# Patient Record
Sex: Female | Born: 1995 | Race: White | Hispanic: No | Marital: Single | Smoking: Never smoker
Health system: Southern US, Community
[De-identification: ages and names within clinical notes are randomized; demographics above are authoritative.]

---
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
## 2015-03-16 ENCOUNTER — Other Ambulatory Visit: Payer: Self-pay | Admitting: Family Medicine

## 2015-03-16 DIAGNOSIS — M25551 Pain in right hip: Secondary | ICD-10-CM

## 2015-03-23 ENCOUNTER — Ambulatory Visit
Admission: RE | Admit: 2015-03-23 | Discharge: 2015-03-23 | Disposition: A | Discharge status: Home / Self Care | Payer: 59 | Source: Ambulatory Visit | Attending: Family Medicine | Admitting: Family Medicine

## 2015-03-23 DIAGNOSIS — M25551 Pain in right hip: Secondary | ICD-10-CM | POA: Insufficient documentation

## 2015-03-23 DIAGNOSIS — Z01812 Encounter for preprocedural laboratory examination: Secondary | ICD-10-CM | POA: Insufficient documentation

## 2015-03-23 LAB — HCG, QUANTITATIVE, PREGNANCY

## 2015-03-23 MED ORDER — IOHEXOL 180 MG/ML  SOLN: 3.0000 mL | Freq: Once | INTRAMUSCULAR | Status: DC | PRN

## 2015-03-23 MED ORDER — METHYLPREDNISOLONE ACETATE 40 MG/ML IJ SUSP
80.0000 mg | Freq: Once | INTRAMUSCULAR | Status: DC
  Filled 2015-03-23: qty 2

## 2015-03-23 MED ORDER — BUPIVACAINE HCL 0.25 % IJ SOLN
7.0000 mL | Freq: Once | INTRAMUSCULAR | Status: DC
  Filled 2015-03-23: qty 7

## 2015-04-21 DIAGNOSIS — R102 Pelvic and perineal pain: Secondary | ICD-10-CM

## 2015-06-08 ENCOUNTER — Ambulatory Visit: Payer: 59 | Admitting: Family Medicine

## 2015-06-09 ENCOUNTER — Ambulatory Visit (INDEPENDENT_AMBULATORY_CARE_PROVIDER_SITE_OTHER): Payer: PRIVATE HEALTH INSURANCE | Admitting: Family Medicine

## 2015-06-09 ENCOUNTER — Other Ambulatory Visit: Payer: Self-pay | Admitting: Family Medicine

## 2015-06-09 ENCOUNTER — Ambulatory Visit
Admission: RE | Admit: 2015-06-09 | Discharge: 2015-06-09 | Disposition: A | Discharge status: Home / Self Care | Payer: 59 | Source: Ambulatory Visit | Attending: Family Medicine | Admitting: Family Medicine

## 2015-06-09 DIAGNOSIS — M25561 Pain in right knee: Secondary | ICD-10-CM

## 2015-06-12 ENCOUNTER — Ambulatory Visit: Payer: 59

## 2015-06-16 NOTE — Progress Notes (Signed)
Patient ID: Wendee Beaversurelia Lacek, female   DOB: 07/10/1995, 20 y.o.   MRN: [id number]  Patient presents today with symptoms of right posterior knee pain. Patient states that she noticed the pain a few days ago. She denies any injury prior to feeling the discomfort. She did initially noticed some bruising but that has resolved. She denies any calf swelling or tenderness. She denies any history of DVT.  ROS: Negative except mentioned above. Vitals: T 98.0, RR 16, HR 68, BP 112/68 GENERAL: NAD HEENT: no pharyngeal erythema, no exudate RESP: CTA B CARD: RRR MSK: no deformity of LEs appreciated, no ecchymosis of LEs or obvious swelling, mild tenderness along proximal aspect of calf muscle along posterior aspect of knee, no knee effusion, no jointline tenderness, FROM of LEs, -Homans, LEs nv intact   A/P: Right posterior knee pain- I believe her pain is likely related to calf muscle strain, however given her symptoms will do a ultrasound to rule out DVT and Baker's cyst. Will inform patient of results once they have been called to me.

## 2015-07-10 HISTORY — PX: HIP ARTHROSCOPY W/ LABRAL REPAIR: SHX1750

## 2015-07-17 ENCOUNTER — Ambulatory Visit (INDEPENDENT_AMBULATORY_CARE_PROVIDER_SITE_OTHER): Payer: 59 | Admitting: Family Medicine

## 2015-07-17 DIAGNOSIS — J069 Acute upper respiratory infection, unspecified: Secondary | ICD-10-CM

## 2015-07-18 NOTE — Progress Notes (Signed)
Patient ID: Marisa Bowen, female   DOB: [birth year], 20 y.o.   MRN: [id number]  Patient presents today with symptoms of postnasal drip, sore throat, cough. Patient states that she's had the symptoms for the last few days. She denies any colored mucus. She has been taking her oral antihistamine from [place]. She denies any fever, chest pain, shortness of breath, headache, nausea, vomiting, diarrhea.  ROS: Negative except mentioned above. Vitals T97.8, RR14, HR 70, BP 110/60  GENERAL: NAD HEENT: mild pharyngeal erythema, no exudate, no erythema of TMs, no cervical LAD RESP: CTA B CARD: RRR NEURO: CN II-XII grossly intact   A/P: URI-likely viral or seasonal allergies, continue antihistamine, Flonase when necessary, Delsym when necessary, rest, hydration, seek medical attention if symptoms persist or worsen. No athletic activity if febrile.

## 2015-12-14 ENCOUNTER — Encounter: Payer: Self-pay | Admitting: Family Medicine

## 2015-12-14 ENCOUNTER — Ambulatory Visit (INDEPENDENT_AMBULATORY_CARE_PROVIDER_SITE_OTHER): Payer: PRIVATE HEALTH INSURANCE | Admitting: Family Medicine

## 2015-12-14 VITALS — BP 129/75 | HR 73 | Temp 98.7°F

## 2015-12-14 DIAGNOSIS — J209 Acute bronchitis, unspecified: Secondary | ICD-10-CM

## 2015-12-14 MED ORDER — AZITHROMYCIN 250 MG PO TABS: ORAL_TABLET | ORAL | 0 refills | Status: DC

## 2015-12-14 MED ORDER — ALBUTEROL SULFATE HFA 108 (90 BASE) MCG/ACT IN AERS: 2.0000 | INHALATION_SPRAY | Freq: Four times a day (QID) | RESPIRATORY_TRACT | 2 refills | Status: DC | PRN

## 2015-12-14 NOTE — Progress Notes (Signed)
Patient presents today with symptoms of persistent cough for the last few weeks. Patient states initially she thought her symptoms got better for a few days but then returned. She does usually have allergies during the spring. She does play golf. She has noticed a little bit of wheezing at times with activity. She denies any calf tenderness or any other chest pain. She denies any documented fever. She has had bronchitis in the past. She denies any nausea, vomiting, abdominal pain, severe headache. She is not taking any medications this morning.  ROS: Negative except mentioned above.  Vitals as per Epic.  GENERAL: NAD HEENT: mild pharyngeal erythema, no exudate, no erythema of TMs, no cervical LAD RESP: CTA B CARD: RRR NEURO: CN II-XII grossly intact   A/P: URI - Z-pk, Claritin, Delsym, Albuterol MDI prn, rest, hydration, seek medical attention if symptoms persist or worsen as discussed.

## 2016-06-18 ENCOUNTER — Ambulatory Visit (INDEPENDENT_AMBULATORY_CARE_PROVIDER_SITE_OTHER): Payer: 59 | Admitting: Family Medicine

## 2016-06-18 ENCOUNTER — Encounter: Payer: Self-pay | Admitting: Family Medicine

## 2016-06-18 VITALS — BP 113/62 | HR 62 | Temp 97.7°F | Resp 14

## 2016-06-18 DIAGNOSIS — J069 Acute upper respiratory infection, unspecified: Secondary | ICD-10-CM

## 2016-06-18 DIAGNOSIS — J3089 Other allergic rhinitis: Secondary | ICD-10-CM

## 2016-06-18 MED ORDER — BENZONATATE 100 MG PO CAPS
100.0000 mg | ORAL_CAPSULE | Freq: Two times a day (BID) | ORAL | 0 refills | Status: AC | PRN
Start: 2016-06-18 — End: ?

## 2016-06-18 NOTE — Progress Notes (Signed)
Patient presents today with symptoms of mild nonproductive cough. Patient states that she's had the symptoms for the last 1-2 weeks. She denies any chest pain or shortness of breath. She has started her allergy medication (antihistamine) for seasonal allergies. She denies any wheezing or history of asthma. She denies any fever.  ROS: Negative except mentioned above. Vitals as per Epic. GENERAL: NAD HEENT: no pharyngeal erythema, no exudate, no erythema of TMs, no cervical LAD RESP: CTA B CARD: RRR  NEURO: CN II-XII grossly intact   A/P: URI, seasonal allergies -will try Tessalon Perles for cough suppressant, continue allergy medication, rest, hydration, if symptoms persist or worsen will do further workup/treatment.

## 2016-06-25 ENCOUNTER — Other Ambulatory Visit: Payer: Self-pay | Admitting: Family Medicine

## 2016-06-25 MED ORDER — AZITHROMYCIN 250 MG PO TABS
ORAL_TABLET | ORAL | 0 refills | Status: DC
Start: 2016-06-25 — End: 2017-03-13

## 2016-11-07 ENCOUNTER — Encounter: Payer: Self-pay | Admitting: Family Medicine

## 2016-11-07 ENCOUNTER — Ambulatory Visit (INDEPENDENT_AMBULATORY_CARE_PROVIDER_SITE_OTHER): Payer: Self-pay | Admitting: Family Medicine

## 2016-11-07 VITALS — BP 121/74 | HR 70 | Temp 97.9°F | Resp 14 | Wt 150.0 lb

## 2016-11-07 DIAGNOSIS — E282 Polycystic ovarian syndrome: Secondary | ICD-10-CM

## 2016-11-07 NOTE — Progress Notes (Signed)
Patient presents today for request for changing of meal plan. Patient has PCOS and would like to look more meals at home. She requests that I write something stating she can change her meal plan based on medical condition. Patient states that she would like to eat more foods with a low glycemic index as she has been told this is helpful for PCOS. She currently has an IUD in place. She states that she does not want to take any medications for her PCOS. A prescription was written to change her meal plan. I have also encouraged her to meet with the nutritionist. She will discuss making an appointment with trainer for this. She will follow up with the gynecologist if she has any further problems.

## 2016-11-14 ENCOUNTER — Encounter: Payer: Self-pay | Admitting: Dietician

## 2016-11-15 NOTE — Progress Notes (Signed)
   Marisa Bowen  Visit Type:  Initial  Appt. Start Time: 4:15pm Appt. End Time: 5:15pm  11/14/16  Ms. 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 States she eats vegan not or moral reasons but rather because meat and dairy "discust" her in [place] and so prefers not to consume these food groups. She has also been eating less gluten-containing foods since learning of her PCOS diagnosis. Foods consumed regularly include soy products, chickpeas, apples, prunes, cous cous, bulgar wheat, vegan crepes. Diet recall reveals frequent skipping of breakfast and dinner meals. Often prefers to snack in the evenings rather than have a complete/balanced meal. On campus she tries to find options in the vegan section at [employer] dining hall and at retail locations like [employer] [facility] [id number]. Admits to not consume much protein unless she cooks for herself at home. Enjoys protein/ veggie pasta varieties like chickpea pasta. Takes Vit C and Biotin supplement. RD advised she ensure that the Biotin supplement has Vit B12 included, and to also consider either consuming more dietary sources of iron or taking an iron supplement.   Individualized Plan  Learning Objective:  Athlete will have a greater understanding of how to consume adequate protein on a vegan diet and of how to eat with POCS (examples: high fiber, antioxidants/ polyphenols/ anti-inflammatory foods)    Plan:   She will continue to try and find different sources of Bowen on campus while following a vegan/ low gluten diet. Should she be unsuccessful at doing this, she wishes to cook meals at home rather than have a traditional meal plan. States she is not willing to consume non-vegan options at this time. RD to contact sports medicine MD.  Expected Outcomes:     1. She will follow diet guidelines for  POCS 2. She will meet with the campus dining hall dietitian, Marchelle Folksmanda, who may be able to further help her find vegan options on campus  Education material provided: Bowen guidelines for PCOS  If problems or questions, patient to contact team via:  Phone and Email  Future Bowen appointment:  prn    Marianna PaymentLisa N Malea Swilling, RD 11/14/16

## 2017-01-28 IMAGING — US US EXTREM LOW VENOUS*R*
1 series · 13 of 24 positions shown · non-contrast
Comparison: None.

CLINICAL DATA: Posterior right knee pain for 8 days.



[Series 1: us extrem low venous*right* · 0.07mm/px · 13 of 32 slices shown]
[im 1/32]
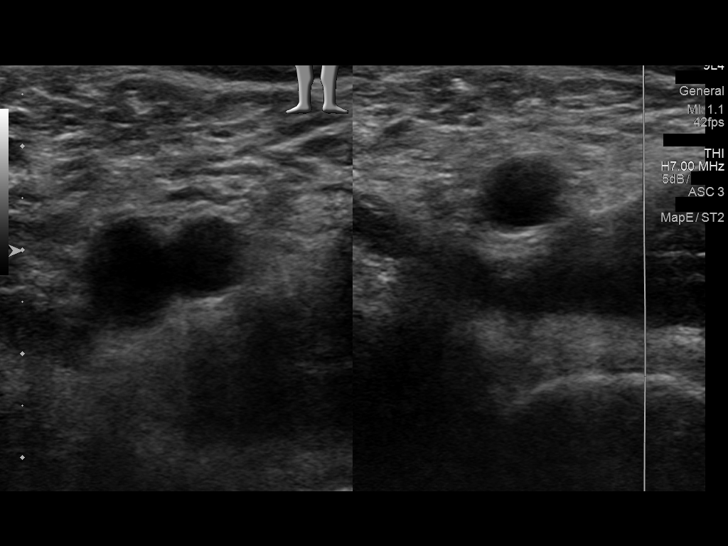
[im 3/32]
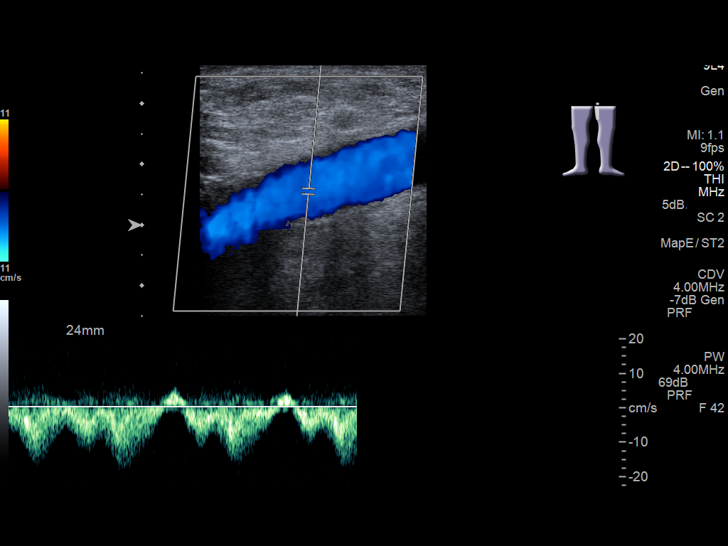
[im 6/32]
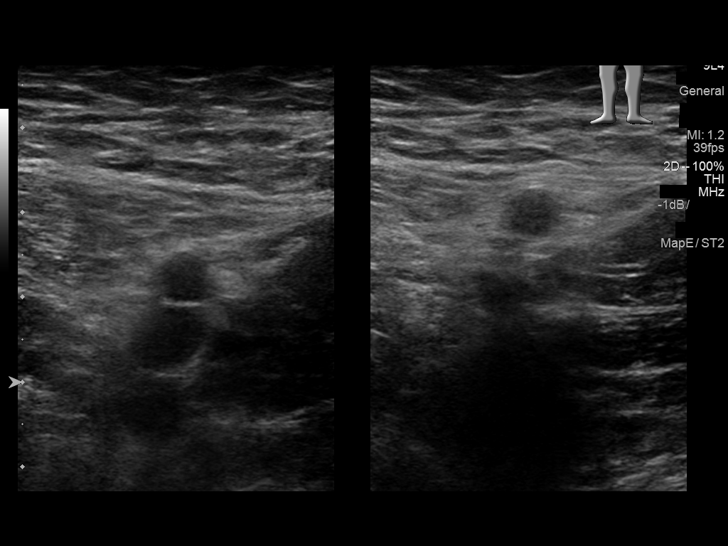
[im 9/32]
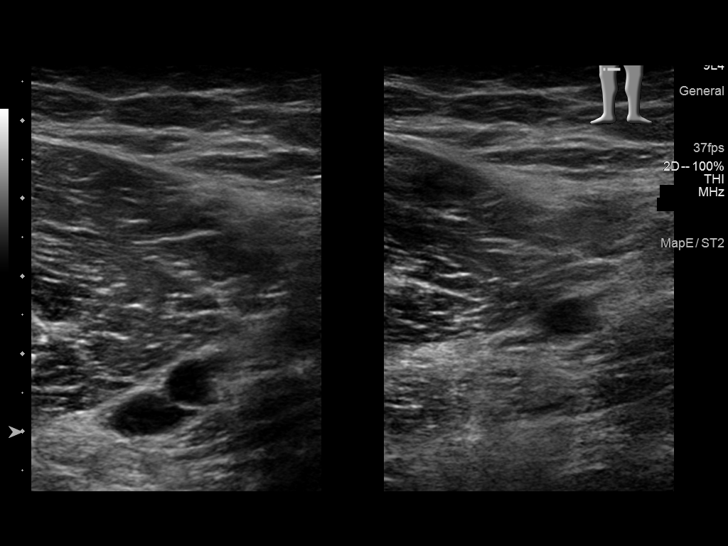
[im 11/32]
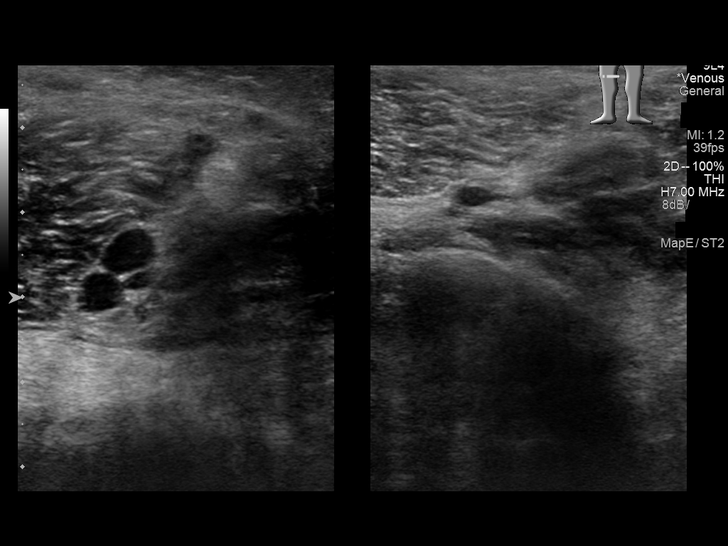
[im 14/32]
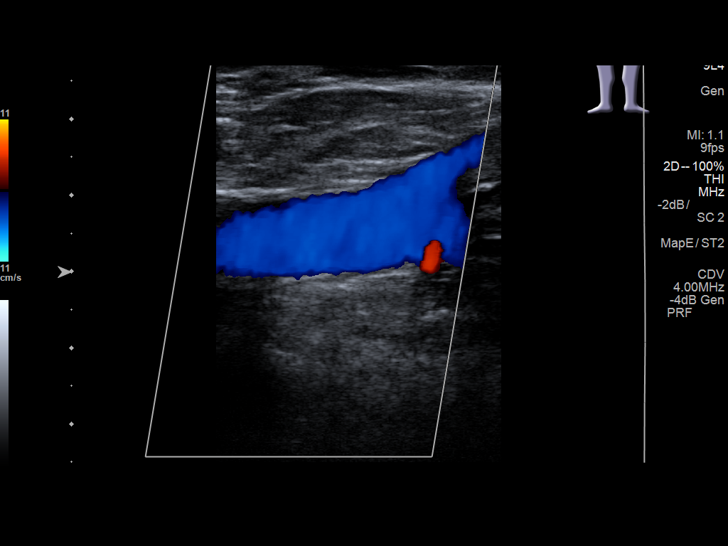
[im 17/32]
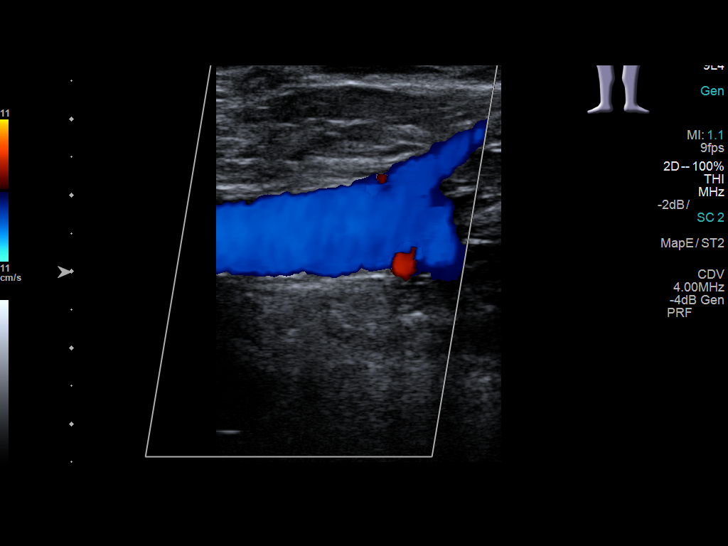
[im 18/32]
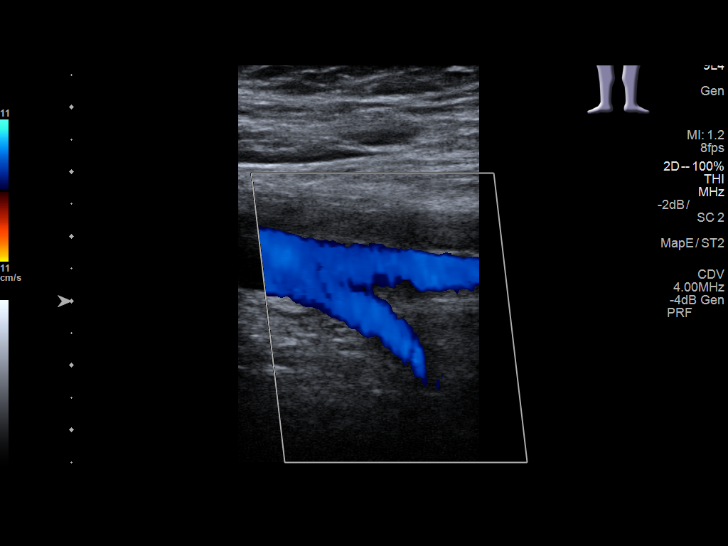
[im 21/32]
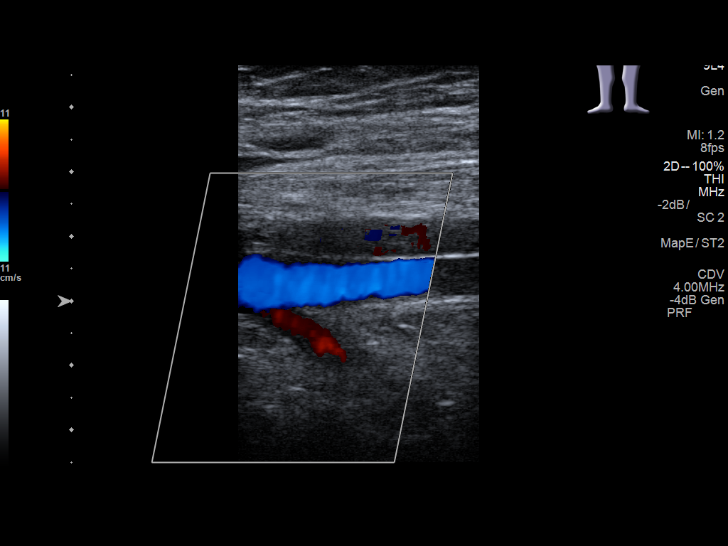
[im 23/32]
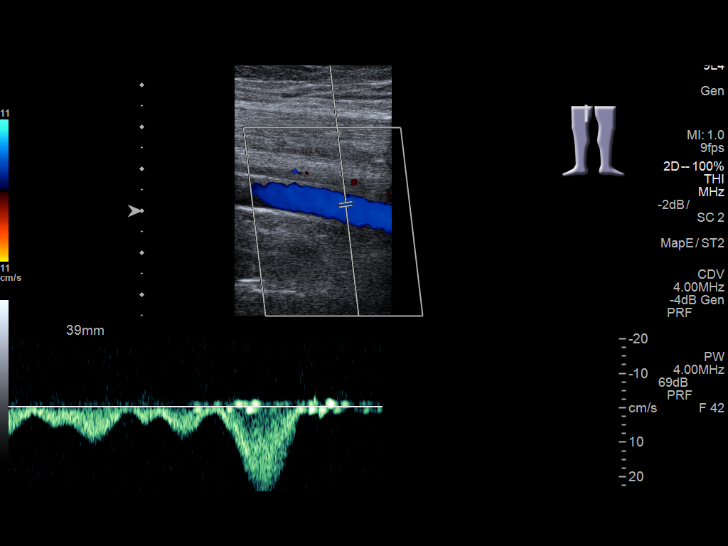
[im 26/32]
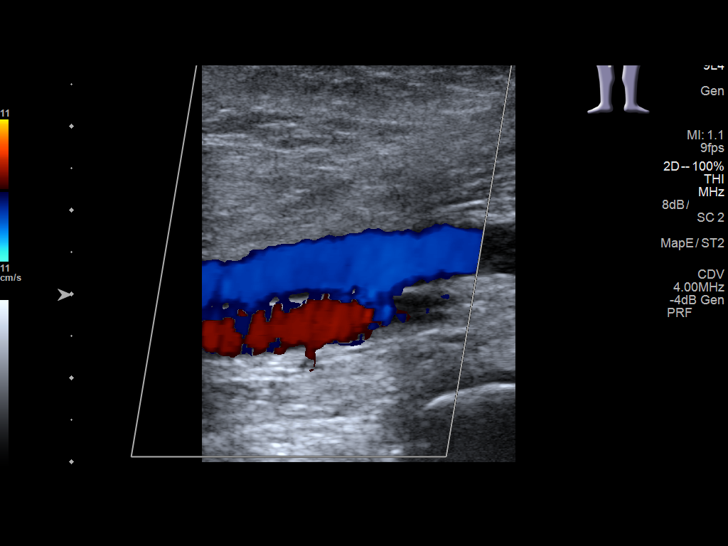
[im 29/32]
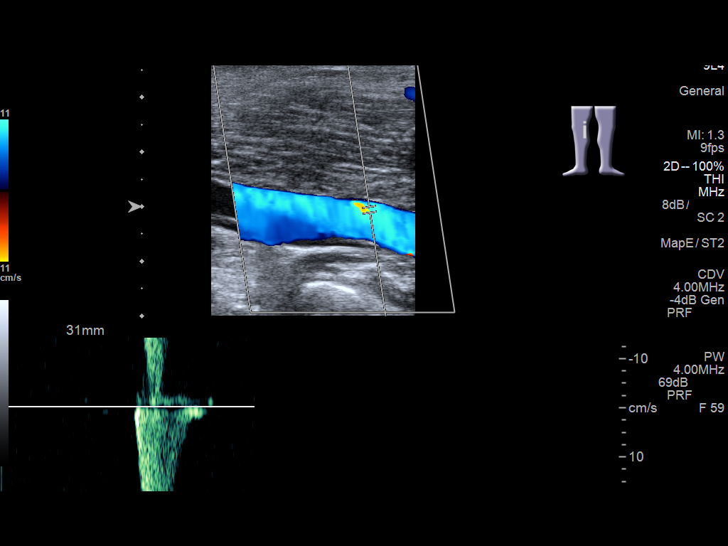
[im 32/32]
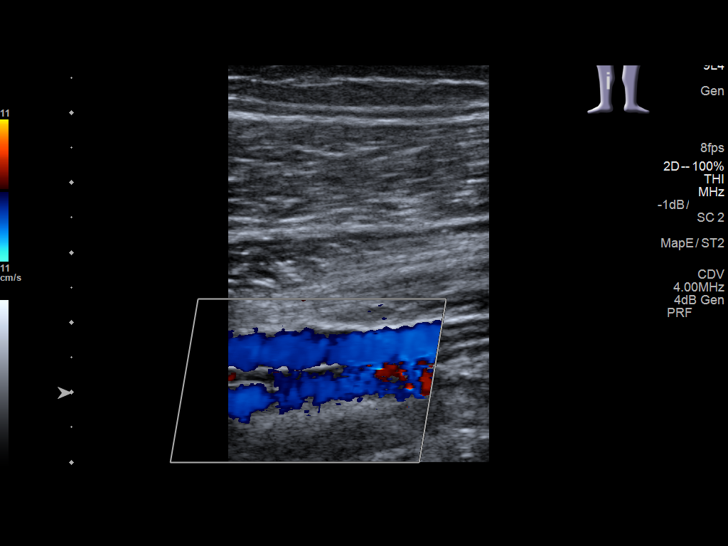

[13 of 24 positions shown; findings below may reference images not displayed]

FINDINGS: Contralateral Common Femoral Vein: Respiratory phasicity is normal
and symmetric with the symptomatic side. No evidence of thrombus.
Normal compressibility.

Common Femoral Vein: No evidence of thrombus. Normal
compressibility, respiratory phasicity and response to augmentation.

Saphenofemoral Junction: No evidence of thrombus. Normal
compressibility and flow on color Doppler imaging.

Profunda Femoral Vein: No evidence of thrombus. Normal
compressibility and flow on color Doppler imaging.

Femoral Vein: No evidence of thrombus. Normal compressibility,
respiratory phasicity and response to augmentation.

Popliteal Vein: No evidence of thrombus. Normal compressibility,
respiratory phasicity and response to augmentation.

Calf Veins: No evidence of thrombus. Normal compressibility and flow
on color Doppler imaging.

Superficial Great Saphenous Vein: No evidence of thrombus. Normal
compressibility and flow on color Doppler imaging.

Venous Reflux:  None.

Other Findings:  None.
IMPRESSION: No evidence of deep venous thrombosis in the right lower extremity.

## 2017-03-13 ENCOUNTER — Ambulatory Visit
Admission: RE | Admit: 2017-03-13 | Discharge: 2017-03-13 | Disposition: A | Discharge status: Home / Self Care | Payer: PRIVATE HEALTH INSURANCE | Source: Ambulatory Visit | Attending: Family Medicine | Admitting: Family Medicine

## 2017-03-13 ENCOUNTER — Ambulatory Visit (INDEPENDENT_AMBULATORY_CARE_PROVIDER_SITE_OTHER): Payer: PRIVATE HEALTH INSURANCE | Admitting: Family Medicine

## 2017-03-13 ENCOUNTER — Encounter: Payer: Self-pay | Admitting: Family Medicine

## 2017-03-13 VITALS — BP 113/72 | HR 57 | Temp 98.0°F | Resp 14

## 2017-03-13 DIAGNOSIS — R059 Cough, unspecified: Secondary | ICD-10-CM

## 2017-03-13 DIAGNOSIS — R05 Cough: Secondary | ICD-10-CM

## 2017-03-13 MED ORDER — ALBUTEROL SULFATE HFA 108 (90 BASE) MCG/ACT IN AERS: 2.0000 | INHALATION_SPRAY | Freq: Four times a day (QID) | RESPIRATORY_TRACT | 2 refills | Status: AC | PRN

## 2017-03-13 MED ORDER — AZITHROMYCIN 250 MG PO TABS: ORAL_TABLET | ORAL | 0 refills | Status: AC

## 2017-03-13 NOTE — Progress Notes (Signed)
Patient presents today with symptoms of mild productive cough. Patient states that she has been coughing for close to 6 weeks. She states the mucus now is green in color. She denies any chest pain or shortness of breath. She has used an albuterol inhaler in the past when she has had upper respiratory infections but has never been diagnosed with asthma. She also does have some postnasal drip. She denies any particular allergens. She is returning from a trip to [place] over winter break. She denies any night sweats, myalgias, weight loss.  ROS: Negative except mentioned above. Vitals as per Epic. GENERAL: NAD HEENT: no pharyngeal erythema, no exudate, no erythema of TMs, mild scarring bilaterally from previous otitis media infections, no cervical LAD RESP: CTA B CARD: RRR EXTREM: no calf swelling or tenderness NEURO: CN II-XII grossly intact   A/P: URI - given her recent travels will do a chest x-ray, will prescribe Z-Pak given the length of time and colored mucus, discussed with patient that there may be an allergen that is causing her symptoms, would suggest taking Claritin for a week or so to see if any improvement, Albuterol when necessary. Seek medical attention if symptoms persist or worsen as discussed.

## 2018-06-18 IMAGING — CR DG CHEST 2V
1 series · 2 of 2 positions shown · non-contrast
Comparison: None in PACs

CLINICAL DATA: Six week history of cough. Onset of fever 2 days
ago.

EXAM:
CHEST  2 VIEW

[Series 1: dg chest 2 view · 0.14mm/px · 2 of 2 slices shown]
[im 1/2]
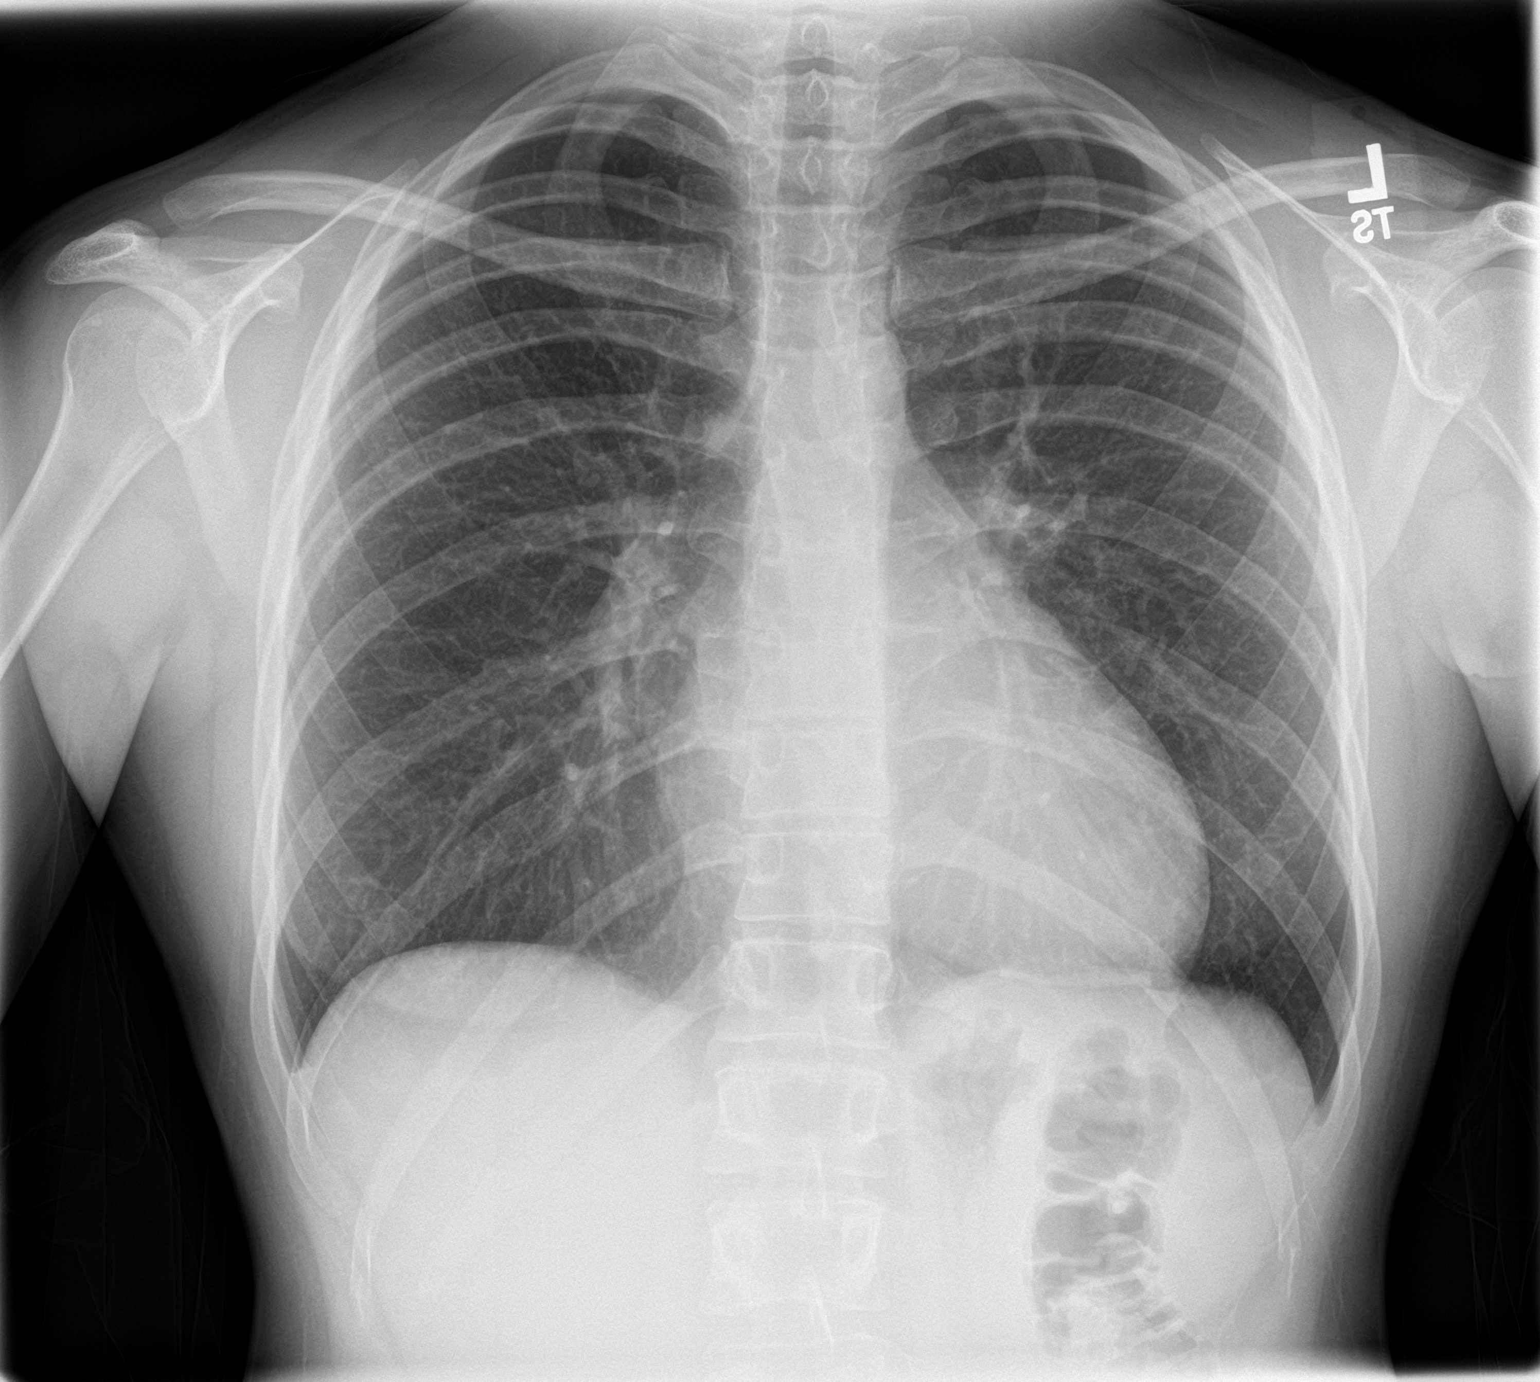
[im 2/2]
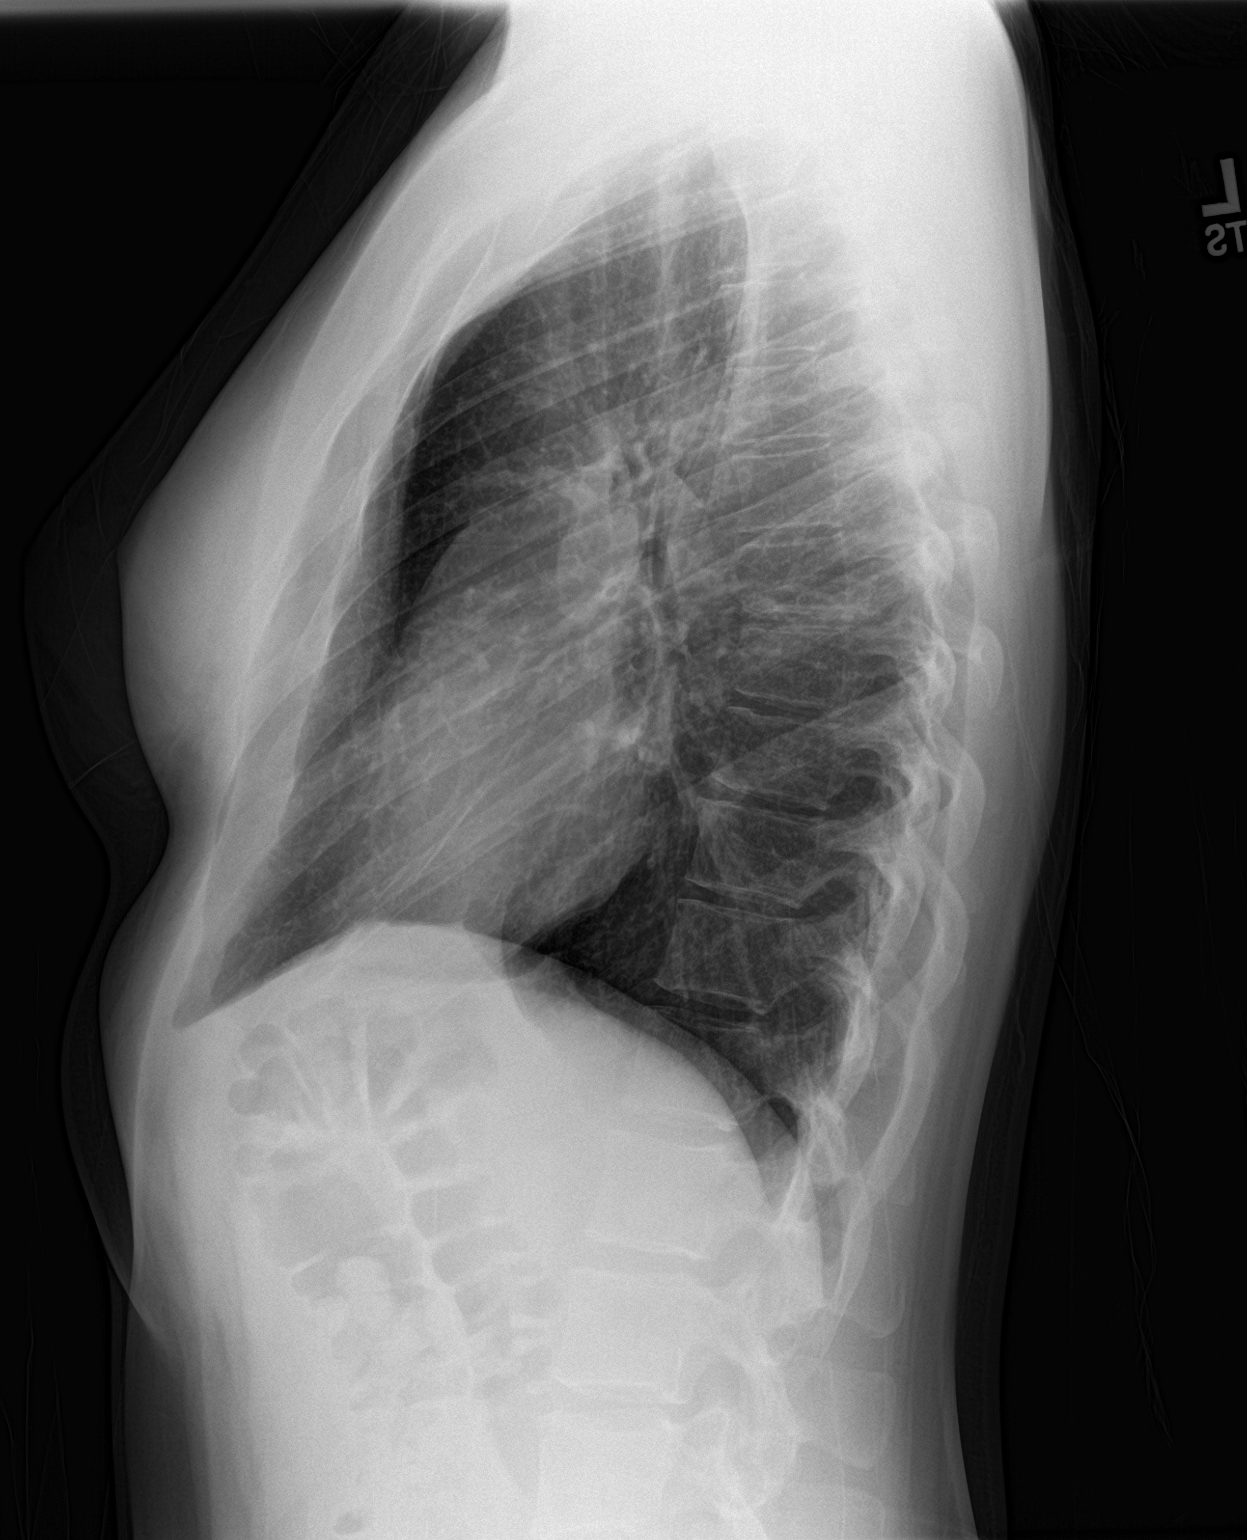

[2 of 2 positions shown; findings below may reference images not displayed]

FINDINGS: The lungs are well-expanded and clear. The heart and pulmonary
vascularity are normal. The mediastinum is normal in width. There is
no pleural effusion. The bony thorax is unremarkable.
IMPRESSION: There is no pneumonia nor other acute cardiopulmonary abnormality.
